# Patient Record
Sex: Male | Born: 1990 | Hispanic: Yes | Marital: Single
Health system: Southern US, Community
[De-identification: ages and names within clinical notes are randomized; demographics above are authoritative.]

---

## 2016-07-01 ENCOUNTER — Emergency Department: Payer: Self-pay

## 2016-07-01 ENCOUNTER — Emergency Department
Admission: EM | Admit: 2016-07-01 | Discharge: 2016-07-01 | Disposition: A | Discharge status: Home / Self Care | Payer: Self-pay | Attending: Emergency Medicine | Admitting: Emergency Medicine

## 2016-07-01 DIAGNOSIS — N200 Calculus of kidney: Secondary | ICD-10-CM | POA: Insufficient documentation

## 2016-07-01 DIAGNOSIS — R109 Unspecified abdominal pain: Secondary | ICD-10-CM

## 2016-07-01 LAB — URINALYSIS, COMPLETE (UACMP) WITH MICROSCOPIC
Bacteria, UA: NONE SEEN
Bilirubin Urine: NEGATIVE
GLUCOSE, UA: NEGATIVE mg/dL
Ketones, ur: 20 mg/dL — AB
Leukocytes, UA: NEGATIVE
Nitrite: NEGATIVE
PH: 6 (ref 5.0–8.0)
PROTEIN: 30 mg/dL — AB
SPECIFIC GRAVITY, URINE: 1.026 (ref 1.005–1.030)
Squamous Epithelial / LPF: NONE SEEN

## 2016-07-01 LAB — BASIC METABOLIC PANEL
Anion gap: 11 (ref 5–15)
BUN: 17 mg/dL (ref 6–20)
CHLORIDE: 101 mmol/L (ref 101–111)
CO2: 26 mmol/L (ref 22–32)
Calcium: 9.2 mg/dL (ref 8.9–10.3)
Creatinine, Ser: 0.98 mg/dL (ref 0.61–1.24)
GFR calc non Af Amer: >60 mL/min (ref >60)
Glucose, Bld: 113 mg/dL — ABNORMAL HIGH (ref 65–99)
Potassium: 3.2 mmol/L — ABNORMAL LOW (ref 3.5–5.1)
Sodium: 138 mmol/L (ref 135–145)

## 2016-07-01 LAB — CBC
HEMATOCRIT: 45.8 % (ref 40.0–52.0)
HEMOGLOBIN: 16.2 g/dL (ref 13.0–18.0)
MCH: 29.6 pg (ref 26.0–34.0)
MCHC: 35.3 g/dL (ref 32.0–36.0)
MCV: 83.7 fL (ref 80.0–100.0)
Platelets: 198 10*3/uL (ref 150–440)
RBC: 5.48 MIL/uL (ref 4.40–5.90)
RDW: 13.4 % (ref 11.5–14.5)
WBC: 13.4 10*3/uL — ABNORMAL HIGH (ref 3.8–10.6)

## 2016-07-01 MED ORDER — TAMSULOSIN HCL 0.4 MG PO CAPS: 0.4000 mg | ORAL_CAPSULE | Freq: Every day | ORAL | 0 refills | Status: AC

## 2016-07-01 MED ORDER — OXYCODONE-ACETAMINOPHEN 5-325 MG PO TABS
ORAL_TABLET | ORAL | Status: AC
  Administered 2016-07-01: 1
  Filled 2016-07-01: qty 1

## 2016-07-01 MED ORDER — ONDANSETRON HCL 4 MG/2ML IJ SOLN
4.0000 mg | Freq: Once | INTRAMUSCULAR | Status: AC
  Administered 2016-07-01: 4 mg via INTRAVENOUS
  Filled 2016-07-01: qty 2

## 2016-07-01 MED ORDER — OXYCODONE-ACETAMINOPHEN 5-325 MG PO TABS: 1.0000 | ORAL_TABLET | ORAL | 0 refills | Status: AC | PRN

## 2016-07-01 MED ORDER — ONDANSETRON 4 MG PO TBDP
4.0000 mg | ORAL_TABLET | Freq: Three times a day (TID) | ORAL | 0 refills | Status: AC | PRN
Start: 2016-07-01 — End: ?

## 2016-07-01 MED ORDER — HYDROMORPHONE HCL 1 MG/ML IJ SOLN
0.5000 mg | Freq: Once | INTRAMUSCULAR | Status: AC
Start: 2016-07-01 — End: 2016-07-01
  Administered 2016-07-01: 0.5 mg via INTRAVENOUS
  Filled 2016-07-01: qty 1

## 2016-07-01 MED ORDER — SODIUM CHLORIDE 0.9 % IV BOLUS (SEPSIS)
1000.0000 mL | Freq: Once | INTRAVENOUS | Status: AC
  Administered 2016-07-01: 1000 mL via INTRAVENOUS

## 2016-07-01 NOTE — ED Triage Notes (Signed)
Pt to triage via w/c with no distress noted; pt c/o N/V with left flank pain radiating into abd since 5pm

## 2016-07-01 NOTE — ED Notes (Signed)
Patient transported to CT 

## 2016-07-01 NOTE — ED Notes (Signed)
MD Dolores FrameSung at bedside w/ interpreter on stick

## 2016-07-01 NOTE — Discharge Instructions (Signed)
1. Take pain & nausea medicines as needed (Percocet/Zofran #20). Make sure to take a stool softener while taking narcotic pain medicines. °2. Take Flomax 0.4mg daily x 14 days. °3. Drink plenty of bottled or filtered water daily. °4. Return to the ER for worsening symptoms, persistent vomiting, fever, difficulty breathing or other concerns.  °

## 2016-07-01 NOTE — ED Provider Notes (Signed)
[facility] Emergency Department Provider Note   ____________________________________________   First MD Initiated Contact with Patient 07/01/16 [phone]     (approximate)  I have reviewed the triage vital signs and the nursing notes.   HISTORY  Chief Complaint Flank Pain  History obtained via Spanish interpreter  HPI Jordan Farley is a 26 y.o. male who presents to the ED from home with a chief complaint of left flank pain. Patient reports an onset of left flank pain approximately 5 PM. Describes sharp shooting pain radiating into his left abdomen and associated with nausea/vomiting.Denies associated fever, chills, chest pain, shortness of breath, dysuria. Denies recent travel or trauma. Nothing makes his symptoms better or worse.   Past medical history None  There are no active problems to display for this patient.   No past surgical history on file.  Prior to Admission medications   Medication Sig Start Date End Date Taking? Authorizing Provider  ondansetron (ZOFRAN ODT) 4 MG disintegrating tablet Take 1 tablet (4 mg total) by mouth every 8 (eight) hours as needed for nausea or vomiting. 07/01/16   Irean Hong, MD  oxyCODONE-acetaminophen (ROXICET) 5-325 MG tablet Take 1 tablet by mouth every 4 (four) hours as needed for severe pain. 07/01/16   Irean Hong, MD  tamsulosin (FLOMAX) 0.4 MG CAPS capsule Take 1 capsule (0.4 mg total) by mouth daily. 07/01/16   Irean Hong, MD    Allergies Patient has no known allergies.  No family history on file.  Social History Social History  Substance Use Topics  . Smoking status: Not on file  . Smokeless tobacco: Not on file  . Alcohol use Not on file  No recent alcohol use  Review of Systems  Constitutional: No fever/chills. Eyes: No visual changes. ENT: No sore throat. Cardiovascular: Denies chest pain. Respiratory: Denies shortness of breath. Gastrointestinal: Positive for left flank pain. No  abdominal pain.  Positive for nausea and vomiting.  No diarrhea.  No constipation. Genitourinary: Negative for dysuria. Musculoskeletal: Negative for back pain. Skin: Negative for rash. Neurological: Negative for headaches, focal weakness or numbness.  10-point ROS otherwise negative.  ____________________________________________   PHYSICAL EXAM:  VITAL SIGNS: ED Triage Vitals  Enc Vitals Group     BP 07/01/16 0022 (!) 162/89     Pulse Rate 07/01/16 0022 76     Resp 07/01/16 0022 18     Temp 07/01/16 0022 98.4 F (36.9 C)     Temp Source 07/01/16 0022 Oral     SpO2 07/01/16 0022 98 %     Weight 07/01/16 0021 175 lb (79.4 kg)     Height 07/01/16 0021 5\' 6"  (1.676 m)     Head Circumference --      Peak Flow --      Pain Score 07/01/16 0020 9     Pain Loc --      Pain Edu? --      Excl. in GC? --     Constitutional: Asleep, awakened for exam. Alert and oriented. Well appearing and in no acute distress. Eyes: Conjunctivae are normal. PERRL. EOMI. Head: Atraumatic. Nose: No congestion/rhinnorhea. Mouth/Throat: Mucous membranes are moist.  Oropharynx non-erythematous. Neck: No stridor.   Cardiovascular: Normal rate, regular rhythm. Grossly normal heart sounds.  Good peripheral circulation. Respiratory: Normal respiratory effort.  No retractions. Lungs CTAB. Gastrointestinal: Soft and nontender to light and deep palpation. No distention. No abdominal bruits. Mild left CVA tenderness. Musculoskeletal: No lower extremity tenderness nor  edema.  No joint effusions. Neurologic:  Normal speech and language. No gross focal neurologic deficits are appreciated. No gait instability. Skin:  Skin is warm, dry and intact. No rash noted. Psychiatric: Mood and affect are normal. Speech and behavior are normal.  ____________________________________________   LABS (all labs ordered are listed, but only abnormal results are displayed)  Labs Reviewed  URINALYSIS, COMPLETE (UACMP) WITH  MICROSCOPIC - Abnormal; Notable for the following:       Result Value   Color, Urine YELLOW (*)    APPearance HAZY (*)    Hgb urine dipstick SMALL (*)    Ketones, ur 20 (*)    Protein, ur 30 (*)    All other components within normal limits  CBC - Abnormal; Notable for the following:    WBC 13.4 (*)    All other components within normal limits  BASIC METABOLIC PANEL - Abnormal; Notable for the following:    Potassium 3.2 (*)    Glucose, Bld 113 (*)    All other components within normal limits   ____________________________________________  EKG  None ____________________________________________  RADIOLOGY  CT renal stone study interpreted per Dr. Harrie JeansStratton: 1. Moderate left hydronephrosis and perinephric stranding secondary  to 2 mm stone likely within the bladder wall segment of left  ureterovesicular junction.  2. 5 mm left lower lobe pulmonary nodule. No follow-up needed if  patient is low-risk. Non-contrast chest CT can be considered in 12  months if patient is high-risk. This recommendation follows the  consensus statement: Guidelines for Management of Incidental  Pulmonary Nodules Detected on CT Images: From the [employer]  2017; Radiology 2017; 284:228-243.   ____________________________________________   PROCEDURES  Procedure(s) performed: None  Procedures  Critical Care performed: No  ____________________________________________   INITIAL IMPRESSION / ASSESSMENT AND PLAN / ED COURSE  Pertinent labs & imaging results that were available during my care of the patient were reviewed by me and considered in my medical decision making (see chart for details).  26 year old male who presents with left flank pain and difficulty urinating. Laboratory urinalysis results are unremarkable. CT demonstrates small left UVJ stone. Pain is well-controlled after 1 dose of IV analgesia. Will discharge home on Flomax, Percocet, Zofran and patient will follow-up with  urology as needed. Strict return precautions given. Patient verbalizes understanding and agrees with plan of care.  I reviewed the patient's prescription history over the last 12 months in the multi-state controlled substances database(s) that includes [place], [place], [place], [place], [place], [place], [place], [place], [place], [place], [place], [place], and [place].  The patient has filled no controlled substances during that time.      ____________________________________________   FINAL CLINICAL IMPRESSION(S) / ED DIAGNOSES  Final diagnoses:  Left flank pain  Kidney stone      NEW MEDICATIONS STARTED DURING THIS VISIT:  New Prescriptions   ONDANSETRON (ZOFRAN ODT) 4 MG DISINTEGRATING TABLET    Take 1 tablet (4 mg total) by mouth every 8 (eight) hours as needed for nausea or vomiting.   OXYCODONE-ACETAMINOPHEN (ROXICET) 5-325 MG TABLET    Take 1 tablet by mouth every 4 (four) hours as needed for severe pain.   TAMSULOSIN (FLOMAX) 0.4 MG CAPS CAPSULE    Take 1 capsule (0.4 mg total) by mouth daily.     Note:  This document was prepared using Dragon voice recognition software and may include unintentional dictation errors.    Irean HongJade J Sung, MD 07/01/16 [phone]

## 2018-10-28 IMAGING — CT CT RENAL STONE PROTOCOL
3 of 4 series · 9 of 46 positions shown, 16 images · non-contrast
Comparison: None.

CLINICAL DATA: 26 y/o  M; nausea and vomiting with left flank pain.

EXAM:
CT ABDOMEN AND PELVIS WITHOUT CONTRAST
TECHNIQUE: Multidetector CT imaging of the abdomen and pelvis was performed
following the standard protocol without IV contrast.

[Series 4: lung bases · axial · 0.73mm/px · z∈[-626,-531]mm · 5 of 29 slices shown, 10 images]
[im 5/29  soft-tissue]
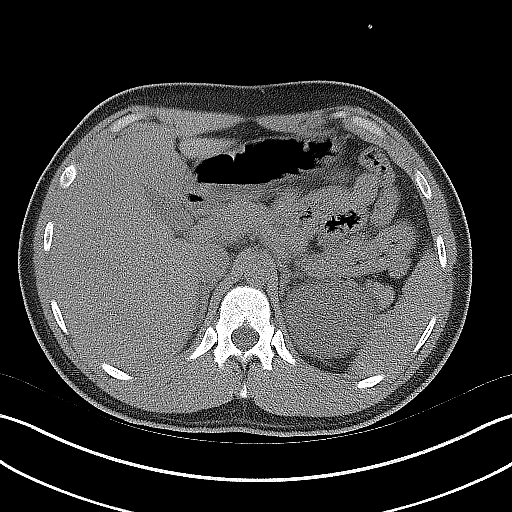
[im 5/29  bone]
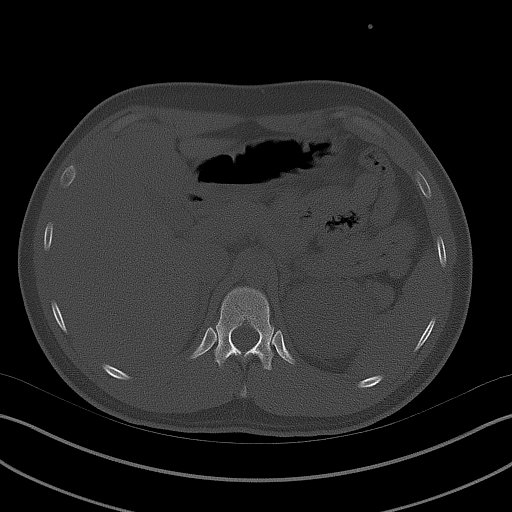
[im 10/29  soft-tissue]
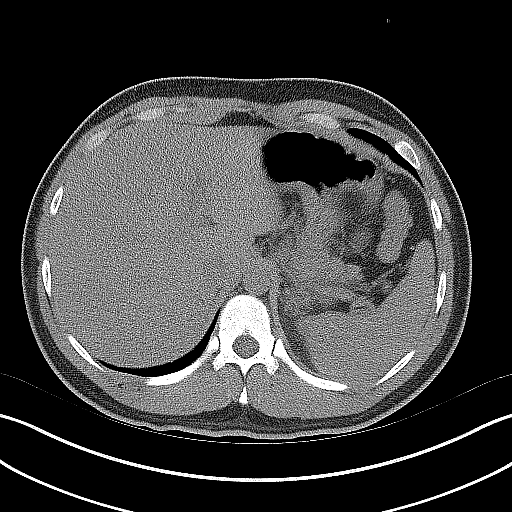
[im 10/29  lung]
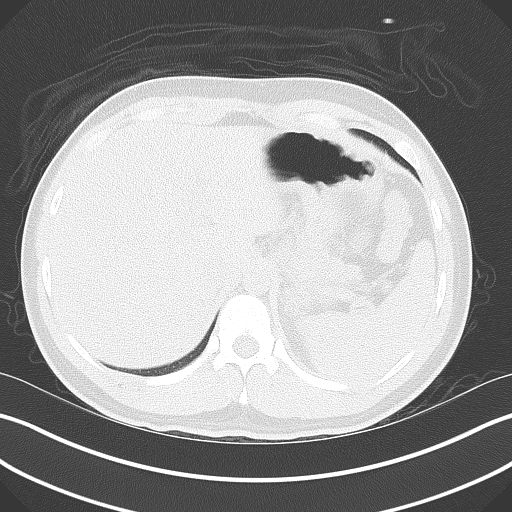
[im 15/29  soft-tissue]
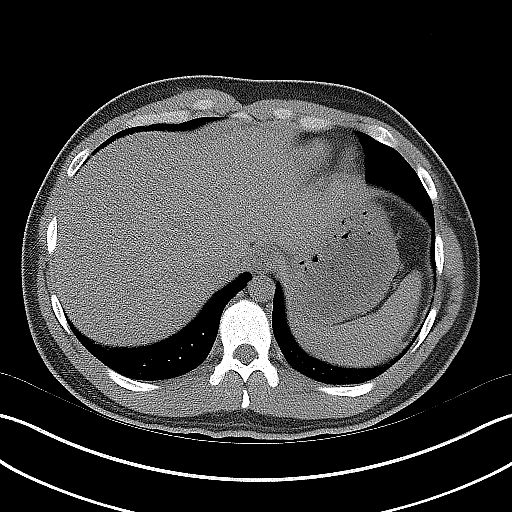
[im 15/29  lung]
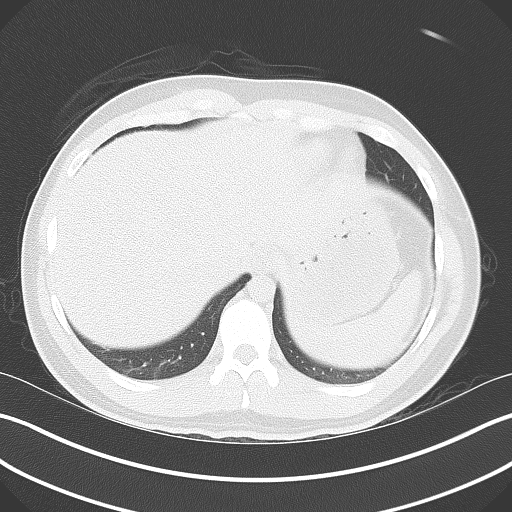
[im 19/29  soft-tissue]
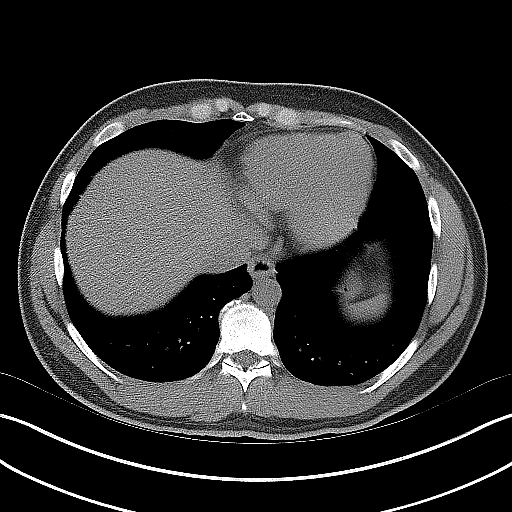
[im 19/29  lung]
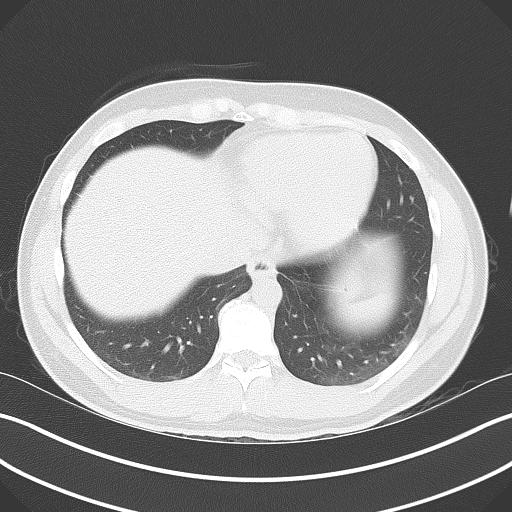
[im 24/29  soft-tissue]
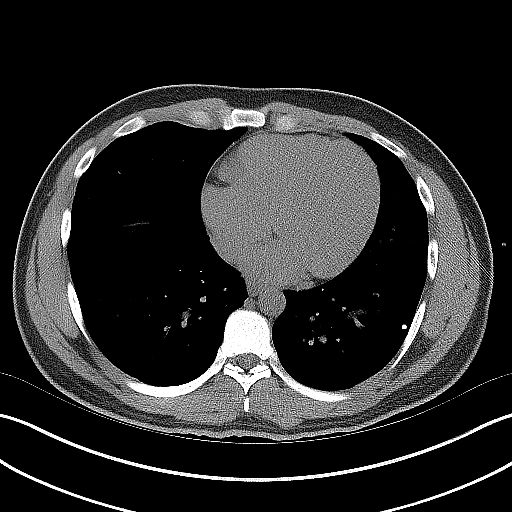
[im 24/29  lung]
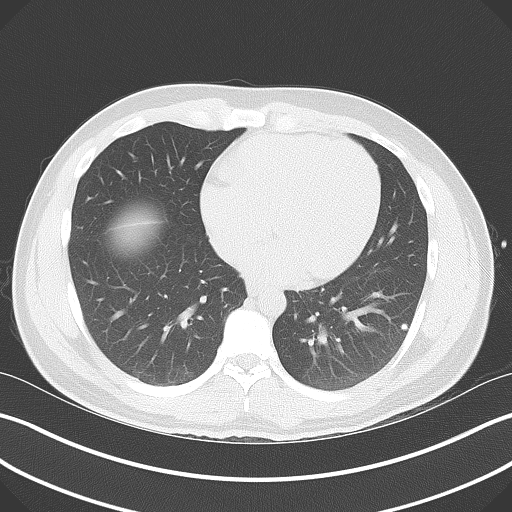

[Series 5: coronal · coronal · 0.83mm/px · 3 of 130 slices shown, 4 images]
[im 44/130  soft-tissue]
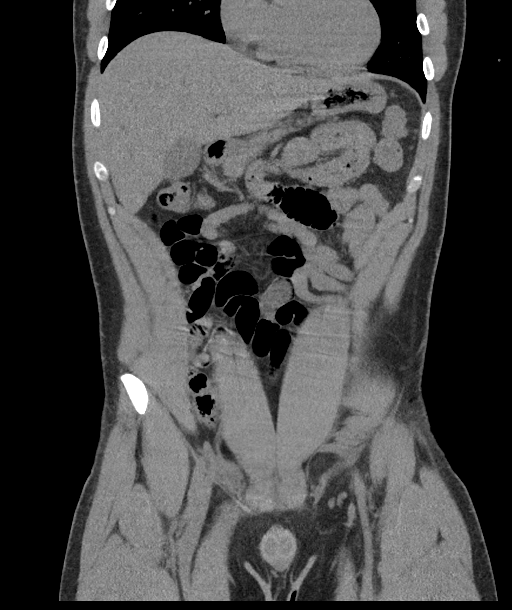
[im 58/130  soft-tissue]
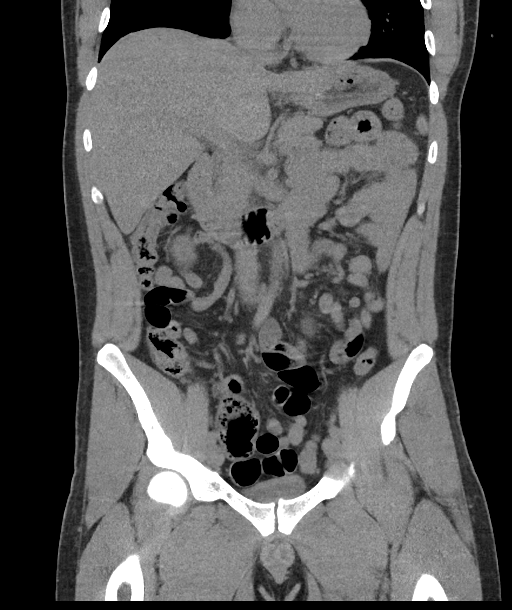
[im 58/130  bone]
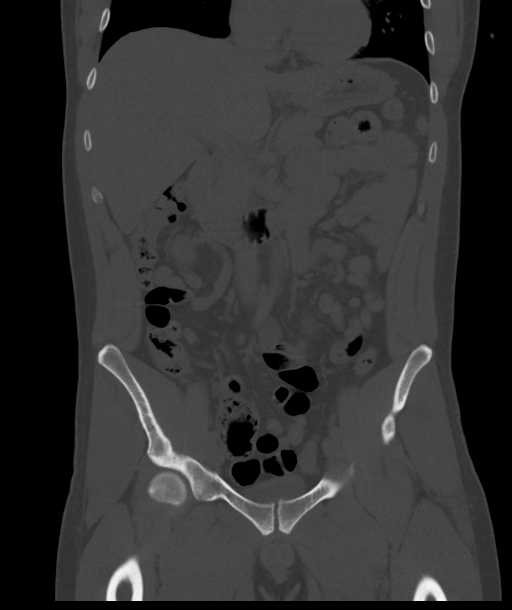
[im 72/130  soft-tissue]
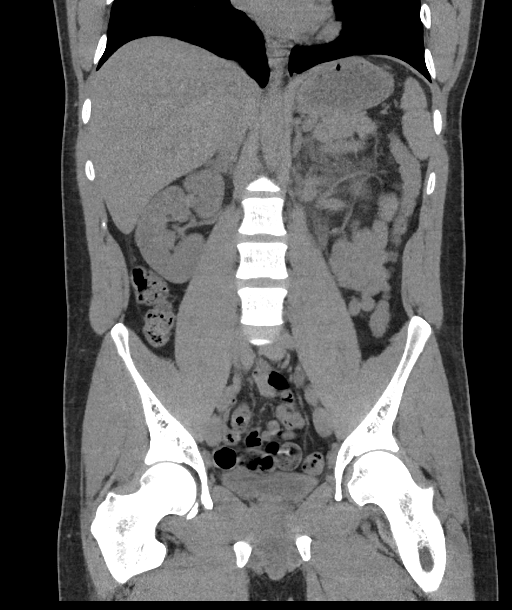

[Series 6: sagittal · sagittal · 0.69mm/px · 1 of 178 slices shown, 2 images]
[im 60/178  soft-tissue]
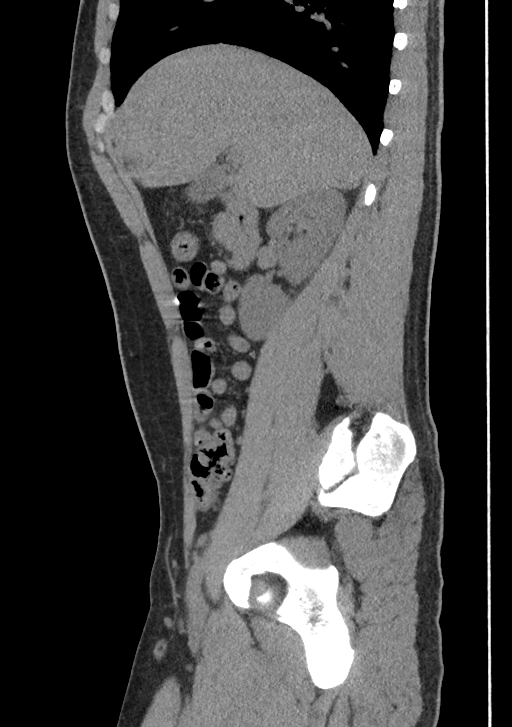
[im 60/178  bone]
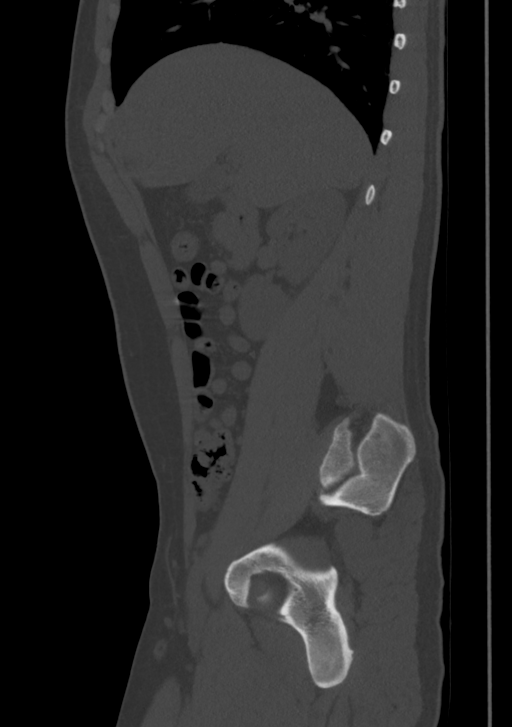

[9 of 46 positions shown; findings below may reference images not displayed]

FINDINGS: Lower chest: 5 mm left lower lobe pulmonary nodule (series 4: Image
6).

Hepatobiliary: No focal liver abnormality is seen. No gallstones,
gallbladder wall thickening, or biliary dilatation.

Pancreas: Unremarkable. No pancreatic ductal dilatation or
surrounding inflammatory changes.

Spleen: Normal in size without focal abnormality.

Adrenals/Urinary Tract: Moderate left hydronephrosis and perinephric
stranding. 2 mm stone within the left posterior bladder probably at
the bladder wall segment of the ureterovesicular junction (series 2
image 80).

No kidney stone, right hydronephrosis, or other bladder abnormality
identified.

Stomach/Bowel: Stomach is within normal limits. Appendix appears
normal. No evidence of bowel wall thickening, distention, or
inflammatory changes.

Vascular/Lymphatic: No significant vascular findings are present. No
enlarged abdominal or pelvic lymph nodes.

Reproductive: Prostate is unremarkable.

Other: No abdominal wall hernia or abnormality. No abdominopelvic
ascites.

Musculoskeletal: No acute or significant osseous findings. Lumbar
degenerative changes greatest at the L5-S1 level right calcified
disc bulge narrows the neural foramen and spinal canal.
IMPRESSION: 1. Moderate left hydronephrosis and perinephric stranding secondary
to 2 mm stone likely within the bladder wall segment of left
ureterovesicular junction.
2. 5 mm left lower lobe pulmonary nodule. No follow-up needed if
patient is low-risk. Non-contrast chest CT can be considered in 12
months if patient is high-risk. This recommendation follows the
consensus statement: Guidelines for Management of Incidental
Pulmonary Nodules Detected on CT Images: From the [HOSPITAL]

By: Zeena Paulo M.D.
